# Patient Record
Sex: Male | Born: 1965 | Race: Black or African American | Hispanic: No | Marital: Single | Smoking: Never smoker
Health system: Southern US, Community
[De-identification: ages and names within clinical notes are randomized; demographics above are authoritative.]

## PROBLEM LIST (undated history)

## (undated) DIAGNOSIS — I219 Acute myocardial infarction, unspecified: Secondary | ICD-10-CM

---

## 2011-07-07 ENCOUNTER — Emergency Department: Payer: Self-pay | Admitting: Unknown Physician Specialty

## 2011-07-07 LAB — COMPREHENSIVE METABOLIC PANEL
Alkaline Phosphatase: 74 U/L (ref 50–136)
Anion Gap: 11 (ref 7–16)
Bilirubin,Total: 0.1 mg/dL — ABNORMAL LOW (ref 0.2–1.0)
Calcium, Total: 8.8 mg/dL (ref 8.5–10.1)
Chloride: 104 mmol/L (ref 98–107)
EGFR (African American): >60
EGFR (Non-African Amer.): >60
Glucose: 212 mg/dL — ABNORMAL HIGH (ref 65–99)
Osmolality: 285 (ref 275–301)
Potassium: 3.8 mmol/L (ref 3.5–5.1)
SGOT(AST): 28 U/L (ref 15–37)
Total Protein: 7.9 g/dL (ref 6.4–8.2)

## 2011-07-07 LAB — CBC WITH DIFFERENTIAL/PLATELET
Basophil #: 0 10*3/uL (ref 0.0–0.1)
Eosinophil #: 0.1 10*3/uL (ref 0.0–0.7)
Eosinophil %: 1.1 %
HGB: 14.2 g/dL (ref 13.0–18.0)
MCV: 90 fL (ref 80–100)
Neutrophil #: 4 10*3/uL (ref 1.4–6.5)
Neutrophil %: 58.2 %
Platelet: 219 10*3/uL (ref 150–440)
RBC: 4.87 10*6/uL (ref 4.40–5.90)
RDW: 14.2 % (ref 11.5–14.5)
WBC: 6.8 10*3/uL (ref 3.8–10.6)

## 2011-07-07 LAB — URINALYSIS, COMPLETE
Bacteria: NONE SEEN
Bilirubin,UR: NEGATIVE
Glucose,UR: 50 mg/dL (ref 0–75)
Ketone: NEGATIVE
Leukocyte Esterase: NEGATIVE
Nitrite: NEGATIVE
Protein: NEGATIVE
Specific Gravity: 1.021 (ref 1.003–1.030)
Squamous Epithelial: <1
WBC UR: <1 /HPF (ref 0–5)

## 2011-07-07 LAB — CK TOTAL AND CKMB (NOT AT ARMC): CK, Total: 304 U/L — ABNORMAL HIGH (ref 35–232)

## 2017-07-27 ENCOUNTER — Encounter (HOSPITAL_COMMUNITY): Payer: Self-pay | Admitting: Emergency Medicine

## 2017-07-27 ENCOUNTER — Emergency Department (HOSPITAL_COMMUNITY): Payer: Medicare Other

## 2017-07-27 ENCOUNTER — Emergency Department (HOSPITAL_COMMUNITY)
Admission: EM | Admit: 2017-07-27 | Discharge: 2017-07-27 | Disposition: A | Discharge status: Home / Self Care | Payer: Medicare Other | Attending: Emergency Medicine | Admitting: Emergency Medicine

## 2017-07-27 DIAGNOSIS — Z7982 Long term (current) use of aspirin: Secondary | ICD-10-CM | POA: Insufficient documentation

## 2017-07-27 DIAGNOSIS — M545 Low back pain: Secondary | ICD-10-CM | POA: Diagnosis not present

## 2017-07-27 DIAGNOSIS — R079 Chest pain, unspecified: Secondary | ICD-10-CM | POA: Diagnosis not present

## 2017-07-27 DIAGNOSIS — R52 Pain, unspecified: Secondary | ICD-10-CM

## 2017-07-27 DIAGNOSIS — R1084 Generalized abdominal pain: Secondary | ICD-10-CM | POA: Diagnosis not present

## 2017-07-27 DIAGNOSIS — Z79899 Other long term (current) drug therapy: Secondary | ICD-10-CM | POA: Insufficient documentation

## 2017-07-27 LAB — URINALYSIS, ROUTINE W REFLEX MICROSCOPIC
Bacteria, UA: NONE SEEN
Bilirubin Urine: NEGATIVE
Glucose, UA: >=500 mg/dL — AB
KETONES UR: NEGATIVE mg/dL
LEUKOCYTES UA: NEGATIVE
NITRITE: NEGATIVE
PROTEIN: NEGATIVE mg/dL
Specific Gravity, Urine: 1.017 (ref 1.005–1.030)
pH: 5 (ref 5.0–8.0)

## 2017-07-27 LAB — CBC
HEMATOCRIT: 42.7 % (ref 39.0–52.0)
Hemoglobin: 13.9 g/dL (ref 13.0–17.0)
MCH: 27.8 pg (ref 26.0–34.0)
MCHC: 32.6 g/dL (ref 30.0–36.0)
MCV: 85.4 fL (ref 78.0–100.0)
Platelets: 245 10*3/uL (ref 150–400)
RBC: 5 MIL/uL (ref 4.22–5.81)
RDW: 14.5 % (ref 11.5–15.5)
WBC: 9.4 10*3/uL (ref 4.0–10.5)

## 2017-07-27 LAB — BASIC METABOLIC PANEL
Anion gap: 9 (ref 5–15)
BUN: 17 mg/dL (ref 6–20)
CHLORIDE: 102 mmol/L (ref 101–111)
CO2: 25 mmol/L (ref 22–32)
Calcium: 9.3 mg/dL (ref 8.9–10.3)
Creatinine, Ser: 1.44 mg/dL — ABNORMAL HIGH (ref 0.61–1.24)
GFR calc Af Amer: >60 mL/min (ref >60)
GFR calc non Af Amer: 56 mL/min — ABNORMAL LOW (ref >60)
Glucose, Bld: 226 mg/dL — ABNORMAL HIGH (ref 65–99)
POTASSIUM: 4.3 mmol/L (ref 3.5–5.1)
SODIUM: 136 mmol/L (ref 135–145)

## 2017-07-27 LAB — HEPATIC FUNCTION PANEL
ALBUMIN: 3.9 g/dL (ref 3.5–5.0)
ALT: 30 U/L (ref 17–63)
AST: 30 U/L (ref 15–41)
Alkaline Phosphatase: 69 U/L (ref 38–126)
BILIRUBIN TOTAL: 0.8 mg/dL (ref 0.3–1.2)
Bilirubin, Direct: 0.2 mg/dL (ref 0.1–0.5)
Indirect Bilirubin: 0.6 mg/dL (ref 0.3–0.9)
Total Protein: 7.3 g/dL (ref 6.5–8.1)

## 2017-07-27 LAB — LIPASE, BLOOD: LIPASE: 31 U/L (ref 11–51)

## 2017-07-27 LAB — ETHANOL

## 2017-07-27 LAB — RAPID URINE DRUG SCREEN, HOSP PERFORMED
Amphetamines: NOT DETECTED
BENZODIAZEPINES: NOT DETECTED
COCAINE: NOT DETECTED
Opiates: NOT DETECTED
Tetrahydrocannabinol: NOT DETECTED

## 2017-07-27 LAB — I-STAT TROPONIN, ED: Troponin i, poc: 0 ng/mL (ref 0.00–0.08)

## 2017-07-27 LAB — CBG MONITORING, ED: Glucose-Capillary: 214 mg/dL — ABNORMAL HIGH (ref 65–99)

## 2017-07-27 MED ORDER — ACETAMINOPHEN 500 MG PO TABS
1000.0000 mg | ORAL_TABLET | Freq: Once | ORAL | Status: DC
  Filled 2017-07-27: qty 2

## 2017-07-27 NOTE — Discharge Instructions (Addendum)
Testing that we did today is normal.  There is no sign of heart attack, bowel obstruction, serious infection or problems with your blood electrolytes.  Continue taking her usual medications.  Use the resource guide attached, to help you find a doctor to see for ongoing care.

## 2017-07-27 NOTE — ED Notes (Signed)
Pt was gone out of the room when this RN went in to discharge the pt. Pt was ambulatory prior to being discharged. Pt was told multiple times to wait for discharge paperwork. Pt was very eager to get home.

## 2017-07-27 NOTE — ED Notes (Signed)
Pt is c/o facial numbness that is not new. Has been going on for years, to left side of the face, no hx of trauma or surgery to back or surrounding area. Hx of heart attack in 2005, no cath was done. Pt has had several TIA incidents. Pt reports polyuria and polydipsia. Reports he goes to a physican once a month.

## 2017-07-27 NOTE — ED Notes (Signed)
Pt aware of need for urine sample. Urinal placed at bedside.

## 2017-07-27 NOTE — ED Triage Notes (Addendum)
Patient here from home with complaints of chest pain that started early this morning. Dizziness all day. Left sided facial numbness that started this morning.

## 2017-07-27 NOTE — ED Provider Notes (Signed)
[facility]-EMERGENCY DEPT Provider Note   CSN: [id number] Arrival date & time: 07/27/17  1711     History   Chief Complaint Chief Complaint  Patient presents with  . Chest Pain    HPI Aaron Meyers is a 52 y.o. male.  HPI   He presents for evaluation of recent symptoms including chest pain, back pain, and abdominal pain.  His abdominal pain is been intermittent and present for a couple of months.  He feels like he has a knot above his umbilicus and is been told that he has a hernia there, in the past.  He is not having any vomiting.  He did not eat earlier today, but is hungry, now.  He also noticed some mid low back pain, today and mid chest pain as well.  He denies cough, shortness of breath, fever, chills, diaphoresis, heart racing, near syncope or syncope.  He is new to [place], and does not have a primary care doctor yet.  He works part-time as a Electrical engineer.  He does not smoke cigarettes, drink alcohol or take illegal drugs.  There are no other known modifying factors.  History reviewed. No pertinent past medical history.  There are no active problems to display for this patient.   History reviewed. No pertinent surgical history.      Home Medications    Prior to Admission medications   Medication Sig Start Date End Date Taking? Authorizing Provider  aspirin EC 81 MG tablet Take 81 mg by mouth daily.   Yes [provider]  bictegravir-emtricitabine-tenofovir AF (BIKTARVY) 50-200-25 MG TABS tablet Take 1 tablet by mouth daily.   Yes [provider]  omeprazole (PRILOSEC) 20 MG capsule Take 20 mg by mouth daily.   Yes [provider]    Family History No family history on file.  Social History Social History   Tobacco Use  . Smoking status: Never Smoker  . Smokeless tobacco: Never Used  Substance Use Topics  . Alcohol use: Never    Frequency: Never  . Drug use: Not on file     Allergies   Patient has  no known allergies.   Review of Systems Review of Systems  All other systems reviewed and are negative.    Physical Exam Updated Vital Signs BP (!) 148/86 (BP Location: Left Arm)   Pulse 85   Temp 98.6 F (37 C) (Oral)   Resp 19   SpO2 98%   Physical Exam  Constitutional: He is oriented to person, place, and time. He appears well-developed. He does not appear ill.  Morbidly obese  HENT:  Head: Normocephalic and atraumatic.  Right Ear: External ear normal.  Left Ear: External ear normal.  Eyes: Pupils are equal, round, and reactive to light. Conjunctivae and EOM are normal.  Neck: Normal range of motion and phonation normal. Neck supple.  Cardiovascular: Normal rate, regular rhythm and normal heart sounds.  Pulmonary/Chest: Effort normal and breath sounds normal. No respiratory distress. He exhibits no bony tenderness.  Abdominal: Soft. He exhibits no distension and no mass. There is tenderness (Mild midline tenderness above the umbilicus without palpable mass.  Note patient very obese.). There is no rebound and no guarding.  Musculoskeletal: Normal range of motion.  Mild diffuse tenderness lumbar region.  No specific tenderness over the thoracic or lumbar spines.  Neurological: He is alert and oriented to person, place, and time. No cranial nerve deficit or sensory deficit. He exhibits normal muscle tone. Coordination normal.  Skin: Skin is warm, dry and intact. No rash noted. No erythema.  Psychiatric: He has a normal mood and affect. His behavior is normal. Judgment and thought content normal.  Nursing note and vitals reviewed.    ED Treatments / Results  Labs (all labs ordered are listed, but only abnormal results are displayed) Labs Reviewed  BASIC METABOLIC PANEL - Abnormal; Notable for the following components:      Result Value   Glucose, Bld 226 (*)    Creatinine, Ser 1.44 (*)    GFR calc non Af Amer 56 (*)    All other components within normal limits    URINALYSIS, ROUTINE W REFLEX MICROSCOPIC - Abnormal; Notable for the following components:   Glucose, UA >=500 (*)    Hgb urine dipstick MODERATE (*)    All other components within normal limits  RAPID URINE DRUG SCREEN, HOSP PERFORMED - Abnormal; Notable for the following components:   Barbiturates   (*)    Value: Result not available. Reagent lot number recalled by manufacturer.   All other components within normal limits  CBG MONITORING, ED - Abnormal; Notable for the following components:   Glucose-Capillary 214 (*)    All other components within normal limits  CBC  ETHANOL  LIPASE, BLOOD  HEPATIC FUNCTION PANEL  I-STAT TROPONIN, ED    EKG EKG Interpretation  Date/Time:  Thursday July 27 2017 17:23:15 EDT Ventricular Rate:  82 PR Interval:    QRS Duration: 94 QT Interval:  374 QTC Calculation: 437 R Axis:   88 Text Interpretation:  Sinus rhythm since last tracing no significant change Confirmed by Mancel Bale [phone]) on 07/27/2017 5:35:07 PM   Radiology Dg Chest 2 View  Result Date: 07/27/2017 CLINICAL DATA:  Initial evaluation for acute chest pain. EXAM: CHEST - 2 VIEW COMPARISON:  None. FINDINGS: The cardiac and mediastinal silhouettes are within normal limits. Aortic atherosclerosis. The lungs are hypoinflated. No airspace consolidation, pleural effusion, or pulmonary edema is identified. There is no pneumothorax. No acute osseous abnormality identified. IMPRESSION: 1. No active cardiopulmonary disease. 2. Aortic atherosclerosis. Electronically Signed   By: Rise Mu M.D.   On: 07/27/2017 19:00   Dg Abd 2 Views  Result Date: 07/27/2017 CLINICAL DATA:  Initial evaluation for acute generalized abdominal pain. EXAM: ABDOMEN - 2 VIEW COMPARISON:  None. FINDINGS: The bowel gas pattern is normal. There is no evidence of free air. No radio-opaque calculi or other significant radiographic abnormality is seen. IMPRESSION: Negative. Electronically Signed   By:  Rise Mu M.D.   On: 07/27/2017 19:01    Procedures Procedures (including critical care time)  Medications Ordered in ED Medications  acetaminophen (TYLENOL) tablet 1,000 mg (1,000 mg Oral Refused 07/27/17 2004)     Initial Impression / Assessment and Plan / ED Course  I have reviewed the triage vital signs and the nursing notes.  Pertinent labs & imaging results that were available during my care of the patient were reviewed by me and considered in my medical decision making (see chart for details).  Clinical Course as of Jul 27 2104  Thu Jul 27, 2017  2047 Normal  Urine rapid drug screen (hosp performed)(!) [EW]  2048 Mild elevation  CBG monitoring, ED(!) [EW]  2048 Normal  I-stat troponin, ED [EW]  2048 Normal  Ethanol [EW]  2048 CBC [EW]  2048 Normal except glucose elevated and creatinine high  Basic metabolic panel(!) [EW]  2048 Normal except elevated glucose, hemoglobin, and RBCs.  Urinalysis, Routine  w reflex microscopic(!) [EW]  2049 No acute disease, images reviewed  DG Chest 2 View [EW]  2050 No obstruction, images reviewed  DG Abd 2 Views [EW]    Clinical Course User Index [EW] Mancel BaleWentz, Lance [place], MD     Patient Vitals for the past 24 hrs:  BP Temp Temp src Pulse Resp SpO2  07/27/17 1929 (!) 148/86 - - 85 19 98 %  07/27/17 1725 115/73 98.6 F (37 C) Oral 86 20 100 %    9:06 PM Reevaluation with update and discussion. After initial assessment and treatment, an updated evaluation reveals he is ambulatory no apparent distress.  Findings discussed with patient all questions answered. Mancel BaleElliott Marivel Mcclarty   Medical Decision Making: Nonspecific back, abdomen, chest pain.  Reassuring evaluation here.  Doubt bowel obstruction, ACS, PE or pneumonia.  CRITICAL CARE-no Performed by: Mancel BaleElliott Leticia Mcdiarmid   Nursing Notes Reviewed/ Care Coordinated Applicable Imaging Reviewed Interpretation of Laboratory Data incorporated into ED treatment  The patient appears  reasonably screened and/or stabilized for discharge and I doubt any other medical condition or other [facility] requiring further screening, evaluation, or treatment in the ED at this time prior to discharge.  Plan: Home Medications-continue current medications; Home Treatments-rest, fluids; return here if the recommended treatment, does not improve the symptoms; Recommended follow up-PCP of choice as needed for ongoing management     Final Clinical Impressions(s) / ED Diagnoses   Final diagnoses:  Pain  Nonspecific chest pain  Generalized abdominal pain    ED Discharge Orders    None       Mancel BaleWentz, Larhonda Dettloff, MD 07/27/17 2108

## 2017-08-02 ENCOUNTER — Emergency Department (HOSPITAL_COMMUNITY)
Admission: EM | Admit: 2017-08-02 | Discharge: 2017-08-02 | Disposition: A | Discharge status: Home / Self Care | Payer: Medicare Other | Attending: Emergency Medicine | Admitting: Emergency Medicine

## 2017-08-02 ENCOUNTER — Other Ambulatory Visit: Payer: Self-pay

## 2017-08-02 ENCOUNTER — Emergency Department (HOSPITAL_COMMUNITY): Payer: Medicare Other

## 2017-08-02 ENCOUNTER — Encounter (HOSPITAL_COMMUNITY): Payer: Self-pay | Admitting: Emergency Medicine

## 2017-08-02 DIAGNOSIS — Z7982 Long term (current) use of aspirin: Secondary | ICD-10-CM | POA: Insufficient documentation

## 2017-08-02 DIAGNOSIS — R319 Hematuria, unspecified: Secondary | ICD-10-CM

## 2017-08-02 DIAGNOSIS — Z21 Asymptomatic human immunodeficiency virus [HIV] infection status: Secondary | ICD-10-CM | POA: Insufficient documentation

## 2017-08-02 DIAGNOSIS — R739 Hyperglycemia, unspecified: Secondary | ICD-10-CM | POA: Insufficient documentation

## 2017-08-02 DIAGNOSIS — R1033 Periumbilical pain: Secondary | ICD-10-CM | POA: Diagnosis not present

## 2017-08-02 DIAGNOSIS — Z79899 Other long term (current) drug therapy: Secondary | ICD-10-CM | POA: Insufficient documentation

## 2017-08-02 DIAGNOSIS — R079 Chest pain, unspecified: Secondary | ICD-10-CM | POA: Diagnosis present

## 2017-08-02 HISTORY — DX: Acute myocardial infarction, unspecified: I21.9

## 2017-08-02 LAB — CBC
HEMATOCRIT: 42.4 % (ref 39.0–52.0)
HEMOGLOBIN: 13.7 g/dL (ref 13.0–17.0)
MCH: 27 pg (ref 26.0–34.0)
MCHC: 32.3 g/dL (ref 30.0–36.0)
MCV: 83.6 fL (ref 78.0–100.0)
Platelets: 270 10*3/uL (ref 150–400)
RBC: 5.07 MIL/uL (ref 4.22–5.81)
RDW: 14.5 % (ref 11.5–15.5)
WBC: 9.2 10*3/uL (ref 4.0–10.5)

## 2017-08-02 LAB — HEPATIC FUNCTION PANEL
ALT: 28 U/L (ref 0–44)
AST: 25 U/L (ref 15–41)
Albumin: 3.9 g/dL (ref 3.5–5.0)
Alkaline Phosphatase: 71 U/L (ref 38–126)
BILIRUBIN DIRECT: 0.1 mg/dL (ref 0.0–0.2)
BILIRUBIN INDIRECT: 0.2 mg/dL — AB (ref 0.3–0.9)
TOTAL PROTEIN: 7.4 g/dL (ref 6.5–8.1)
Total Bilirubin: 0.3 mg/dL (ref 0.3–1.2)

## 2017-08-02 LAB — URINALYSIS, ROUTINE W REFLEX MICROSCOPIC
BACTERIA UA: NONE SEEN
Bilirubin Urine: NEGATIVE
Ketones, ur: NEGATIVE mg/dL
LEUKOCYTES UA: NEGATIVE
NITRITE: NEGATIVE
PH: 5 (ref 5.0–8.0)
Protein, ur: 100 mg/dL — AB
SPECIFIC GRAVITY, URINE: 1.022 (ref 1.005–1.030)

## 2017-08-02 LAB — BASIC METABOLIC PANEL
ANION GAP: 8 (ref 5–15)
BUN: 16 mg/dL (ref 6–20)
CALCIUM: 9.6 mg/dL (ref 8.9–10.3)
CO2: 26 mmol/L (ref 22–32)
Chloride: 102 mmol/L (ref 98–111)
Creatinine, Ser: 1.39 mg/dL — ABNORMAL HIGH (ref 0.61–1.24)
GFR calc Af Amer: >60 mL/min (ref >60)
GFR, EST NON AFRICAN AMERICAN: 58 mL/min — AB (ref >60)
Glucose, Bld: 283 mg/dL — ABNORMAL HIGH (ref 70–99)
POTASSIUM: 4 mmol/L (ref 3.5–5.1)
SODIUM: 136 mmol/L (ref 135–145)

## 2017-08-02 LAB — I-STAT TROPONIN, ED: TROPONIN I, POC: 0.01 ng/mL (ref 0.00–0.08)

## 2017-08-02 LAB — LIPASE, BLOOD: Lipase: 29 U/L (ref 11–51)

## 2017-08-02 MED ORDER — GI COCKTAIL ~~LOC~~
30.0000 mL | Freq: Once | ORAL | Status: AC
  Administered 2017-08-02: 30 mL via ORAL
  Filled 2017-08-02: qty 30

## 2017-08-02 MED ORDER — MORPHINE SULFATE (PF) 4 MG/ML IV SOLN
4.0000 mg | Freq: Once | INTRAVENOUS | Status: AC
  Administered 2017-08-02: 4 mg via INTRAVENOUS
  Filled 2017-08-02: qty 1

## 2017-08-02 MED ORDER — IOPAMIDOL (ISOVUE-300) INJECTION 61%: 100.0000 mL | Freq: Once | INTRAVENOUS | Status: DC | PRN

## 2017-08-02 MED ORDER — PROCHLORPERAZINE EDISYLATE 10 MG/2ML IJ SOLN
10.0000 mg | Freq: Once | INTRAMUSCULAR | Status: DC
  Filled 2017-08-02: qty 2

## 2017-08-02 MED ORDER — DIPHENHYDRAMINE HCL 50 MG/ML IJ SOLN
25.0000 mg | Freq: Once | INTRAMUSCULAR | Status: DC
  Filled 2017-08-02: qty 1

## 2017-08-02 MED ORDER — IOPAMIDOL (ISOVUE-300) INJECTION 61%
100.0000 mL | Freq: Once | INTRAVENOUS | Status: AC | PRN
  Administered 2017-08-02: 100 mL via INTRAVENOUS

## 2017-08-02 MED ORDER — KETOROLAC TROMETHAMINE 30 MG/ML IJ SOLN
30.0000 mg | Freq: Once | INTRAMUSCULAR | Status: AC
  Administered 2017-08-02: 30 mg via INTRAVENOUS
  Filled 2017-08-02: qty 1

## 2017-08-02 MED ORDER — ACETAMINOPHEN 325 MG PO TABS
650.0000 mg | ORAL_TABLET | Freq: Once | ORAL | Status: AC
  Administered 2017-08-02: 650 mg via ORAL
  Filled 2017-08-02: qty 2

## 2017-08-02 NOTE — ED Triage Notes (Addendum)
Pt from home with CP, central abdominal pain, leg weakness, and facial numbness. Pt states symptoms have been ongoing x months and he was seen on 6/20 for same. Pt states his central abdominal pain is the "worst" of his symptoms. Pt states it is central pain that is 8/10 and he describes it as "sharp, burning, and bloating" pt states pain is worse after eating. PT denies smoking, etoh, or drug use. Pt is not sensitive to palpation. Pt states he has been told previously he has a hernia. Pt denies nausea. Pt states he is concerned that he has stomach cancer. He has no reason other than pain to think this. Pt has no neuro deficits

## 2017-08-02 NOTE — ED Notes (Signed)
Pt left without receiving d/c paperwork or allowing tech to do d/c vitals.

## 2017-08-02 NOTE — ED Notes (Signed)
Pt informed he was up for discharge. Attempted to obtain discharge V/S, pt refused. Pt left w/o obtaining discharge papers or speaking with nurse.

## 2017-08-02 NOTE — ED Notes (Signed)
After pt asked multiple times for something for his headache, pt refused migraine cocktail. Pt also refuses cardiac monitoring to NT. Pt reports "There is nothing wrong with my heart. It is not needed."

## 2017-08-02 NOTE — ED Provider Notes (Signed)
[facility]-EMERGENC[facility] DEPT Provider Note   CSN: [id number] Arrival date & time: 08/02/17  1517     History   Chief Complaint Chief Complaint  Patient presents with  . Chest Pain  . Extremity Weakness  . Abdominal Pain    HPI Aaron Meyers is a 52 y.o. male.  The history is provided by the patient and medical records. No language interpreter was used.  Abdominal Pain   This is a chronic problem. The current episode started more than 1 week ago. The problem occurs daily. The problem has not changed since onset.The pain is associated with eating. The pain is located in the generalized abdominal region, periumbilical region and chest. The quality of the pain is burning and aching. The pain is at a severity of 10/10. The pain is severe. Associated symptoms include frequency and headaches. Pertinent negatives include anorexia, fever, belching, diarrhea, flatus, nausea, vomiting, constipation, dysuria and hematuria. The symptoms are aggravated by palpation and eating. Nothing relieves the symptoms. Past medical history comments: hernia.    Past Medical History:  Diagnosis Date  . MI (myocardial infarction) (HCC)     There are no active problems to display for this patient.   History reviewed. No pertinent surgical history.      Home Medications    Prior to Admission medications   Medication Sig Start Date End Date Taking? Authorizing Provider  aspirin EC 81 MG tablet Take 81 mg by mouth daily.    [provider]  bictegravir-emtricitabine-tenofovir AF (BIKTARVY) 50-200-25 MG TABS tablet Take 1 tablet by mouth daily.    [provider]  omeprazole (PRILOSEC) 20 MG capsule Take 20 mg by mouth daily.    [provider]    Family History No family history on file.  Social History Social History   Tobacco Use  . Smoking status: Never Smoker  . Smokeless tobacco: Never Used  Substance Use Topics  . Alcohol use: Never   Frequency: Never  . Drug use: Not on file     Allergies   Patient has no known allergies.   Review of Systems Review of Systems  Constitutional: Positive for fatigue. Negative for appetite change, chills, diaphoresis, fever and unexpected weight change.  HENT: Negative for congestion and rhinorrhea.   Eyes: Negative for photophobia and visual disturbance.  Respiratory: Negative for cough, chest tightness, shortness of breath, wheezing and stridor.   Cardiovascular: Positive for chest pain. Negative for palpitations and leg swelling.  Gastrointestinal: Positive for abdominal pain. Negative for anorexia, constipation, diarrhea, flatus, nausea and vomiting.  Genitourinary: Positive for frequency. Negative for dysuria, flank pain and hematuria.  Musculoskeletal: Positive for back pain. Negative for neck pain and neck stiffness.  Skin: Negative for rash and wound.  Neurological: Positive for numbness and headaches. Negative for dizziness, seizures, facial asymmetry, weakness and light-headedness.  Psychiatric/Behavioral: Negative for agitation.  All other systems reviewed and are negative.    Physical Exam Updated Vital Signs BP 133/89   Pulse 94   Temp 97.9 F (36.6 C) (Oral)   Resp (!) 21   SpO2 96%   Physical Exam  Constitutional: He appears well-developed and well-nourished.  Non-toxic appearance. He does not appear ill. No distress.  HENT:  Head: Normocephalic and atraumatic.  Mouth/Throat: Oropharynx is clear and moist. No oropharyngeal exudate.  Eyes: Pupils are equal, round, and reactive to light. Conjunctivae and EOM are normal. No scleral icterus.  Neck: Normal range of motion. Neck supple.  Cardiovascular: Normal rate,  regular rhythm and normal heart sounds.  No murmur heard. Pulmonary/Chest: Effort normal and breath sounds normal. No stridor. No respiratory distress. He has no wheezes. He has no rhonchi. He has no rales. He exhibits no tenderness.  Abdominal: Soft.  Normal appearance and bowel sounds are normal. There is generalized tenderness. There is no rigidity, no rebound, no guarding and no CVA tenderness.  Musculoskeletal: He exhibits no edema.  Lymphadenopathy:    He has no cervical adenopathy.  Neurological: He is alert.  Skin: Skin is warm and dry. Capillary refill takes less than 2 seconds. He is not diaphoretic. No erythema. No pallor.  Psychiatric: He has a normal mood and affect.  Nursing note and vitals reviewed.    ED Treatments / Results  Labs (all labs ordered are listed, but only abnormal results are displayed) Labs Reviewed  BASIC METABOLIC PANEL - Abnormal; Notable for the following components:      Result Value   Glucose, Bld 283 (*)    Creatinine, Ser 1.39 (*)    GFR calc non Af Amer 58 (*)    All other components within normal limits  HEPATIC FUNCTION PANEL - Abnormal; Notable for the following components:   Indirect Bilirubin 0.2 (*)    All other components within normal limits  URINALYSIS, ROUTINE W REFLEX MICROSCOPIC - Abnormal; Notable for the following components:   Glucose, UA >=500 (*)    Hgb urine dipstick MODERATE (*)    Protein, ur 100 (*)    All other components within normal limits  URINE CULTURE  CBC  LIPASE, BLOOD  I-STAT TROPONIN, ED    EKG EKG Interpretation  Date/Time:  Wednesday August 02 2017 15:28:15 EDT Ventricular Rate:  93 PR Interval:    QRS Duration: 94 QT Interval:  367 QTC Calculation: 457 R Axis:   87 Text Interpretation:  Sinus rhythm When compared to ECG 6 days ago, no significant changes seen with similar S1Q3T3 pattern.  No STEMI Confirmed by Theda Belfast ([id number]) on 08/02/2017 4:18:32 PM Also confirmed by Theda Belfast ([id number]), editor Josephine Igo [phone])  on 08/02/2017 4:38:50 PM   Radiology Dg Chest 2 View  Result Date: 08/02/2017 CLINICAL DATA:  Chest pain EXAM: CHEST - 2 VIEW COMPARISON:  07/27/2017 chest radiograph. FINDINGS: Stable cardiomediastinal silhouette  with normal heart size. No pneumothorax. No pleural effusion. Lungs appear clear, with no acute consolidative airspace disease and no pulmonary edema. IMPRESSION: No active cardiopulmonary disease. Electronically Signed   By: Delbert Phenix M.D.   On: 08/02/2017 17:44    Procedures Procedures (including critical care time)  Medications Ordered in ED Medications  gi cocktail (Maalox,Lidocaine,Donnatal) (30 mLs Oral Given 08/02/17 1659)  morphine 4 MG/ML injection 4 mg (4 mg Intravenous Given 08/02/17 1659)  acetaminophen (TYLENOL) tablet 650 mg (650 mg Oral Given 08/02/17 1758)  ketorolac (TORADOL) 30 MG/ML injection 30 mg (30 mg Intravenous Given 08/02/17 1909)  iopamidol (ISOVUE-300) 61 % injection 100 mL (100 mLs Intravenous Contrast Given 08/02/17 1849)     Initial Impression / Assessment and Plan / ED Course  I have reviewed the triage vital signs and the nursing notes.  Pertinent labs & imaging results that were available during my care of the patient were reviewed by me and considered in my medical decision making (see chart for details).     Aaron Meyers is a 52 y.o. male with a past medical history for borderline diabetes, well-controlled HIV, and prior MI in 2005 who presents with abdominal pain, abdominal  distention, fatigue, chest pain, bilateral leg weakness, intermittent headaches, urinary frequency, and intermittent paresthesias.  Patient reports that he is most concerned that he has abdominal cancer after reading about on the Internet.  Patient says that he has had on and off abdominal pain for the last month.  He reports that he was seen last week with a reassuring work-up but is continued to be concerned and have symptoms.  He reports that he has had normal bowel movements with no conservation or diarrhea.  She denies nausea or vomiting.  He denies any night sweats or weight loss.  He reports that he has a history of a umbilical hernia that has also been the location of severe pain.   He describes his abdominal pain is 10 out of 10 and intermittent.  He reports that wraps around towards his back.  He reports generalized fatigue, bilateral weakness in his legs, and intermittent headaches.  He reports occasional paresthesias including in his jaw.  He reports his abdominal pain and chest pain are burning and aching.  He reports that his HIV is well controlled and his last CD4 was reportedly well and his viral load was undetectable.  He denies other complaints on arrival.  On exam, abdomen is tender at the location of his periumbilical hernia.  Patient has a large abdomen and it is difficult to palpate the hernia.  Normal bowel sounds are appreciated.  Chest is nontender and lungs are clear.  Patient has no weakness in extremities and no numbness appreciated.  No neck stiffness or neck tenderness.  Back nontender.    Patient's EKG appeared unchanged from prior.  No STEMI seen.  With the patient's reported umbilical hernia with abdominal distention and tense abdominal pain, it is felt appropriate for him to have laboratory testing and CT imaging to rule out incarceration.  Patient will also have chest x-ray, and lab testing to further evaluate his chest pain.  Patient Will have urinalysis given his urinary frequency.    Patient will be given GI cocktail as his burning pain in his abdomen and chest when he lays dow or after eating sounds somewhat reflux related.  He also given pain medicine for the possible hernia.  Anticipate reassessment after work-up.  8:06 PM When patient went into CT scanner for his CT head and CT abdomen, he sat up and said he could not do it.  When patient was reassessed, he reports his symptoms resolved.  He would rather go home.  Given his reassuring work-up and his resolved symptoms, I feel patient is safe for discharge home.  Patient instructed to follow-up with his PCP and stay hydrated.  He was instructed to return with any new or worsened symptoms.   Patient was understanding the plan of care and was discharged in good condition.  Final Clinical Impressions(s) / ED Diagnoses   Final diagnoses:  Periumbilical abdominal pain  Hyperglycemia  Hematuria, unspecified type    ED Discharge Orders    None     Clinical Impression: 1. Periumbilical abdominal pain   2. Hyperglycemia   3. Hematuria, unspecified type     Disposition: Discharge  Condition: Good  I have discussed the results, Dx and Tx plan with the pt(& family if present). He/she/they expressed understanding and agree(s) with the plan. Discharge instructions discussed at great length. Strict return precautions discussed and pt &/or family have verbalized understanding of the instructions. No further questions at time of discharge.    New Prescriptions   No  medications on file    Follow Up: [facility] [address] [place] [address] [phone] Schedule an appointment as soon as possible for a visit    [facility] [address] [id number] mc [place] [address] [phone]       Tomoko Sandra, Canary Brim, MD 08/03/17 [phone]

## 2017-08-02 NOTE — ED Notes (Addendum)
Pt has called out multiple times regarding headache

## 2017-08-02 NOTE — Discharge Instructions (Addendum)
Your work-up today was overall reassuring.  We did find you still had some blood in your urine, however your kidney function was improving.  We initially ordered a CT of your abdomen/pelvis as well as her head but you did not tolerate this.  Since her symptoms have improved and you want to go home, we feel you are safe for discharge home.  Please follow-up with your primary doctor in the next several days.  If any symptoms change or worsen, please return to the nearest emerge department.

## 2017-08-02 NOTE — ED Notes (Signed)
Pt stated he did not want to be on cardiac monitoring and that he was not here for "anything like that".

## 2017-08-02 NOTE — ED Notes (Signed)
Pt continues to refuse cardiac monitoring

## 2017-08-03 LAB — URINE CULTURE: Culture: NO GROWTH

## 2019-12-13 IMAGING — CR DG CHEST 2V
2 series · 2 of 2 positions shown · non-contrast
Comparison: 07/27/2017 chest radiograph.

CLINICAL DATA: Chest pain

EXAM:
CHEST - 2 VIEW

[w chest lat]
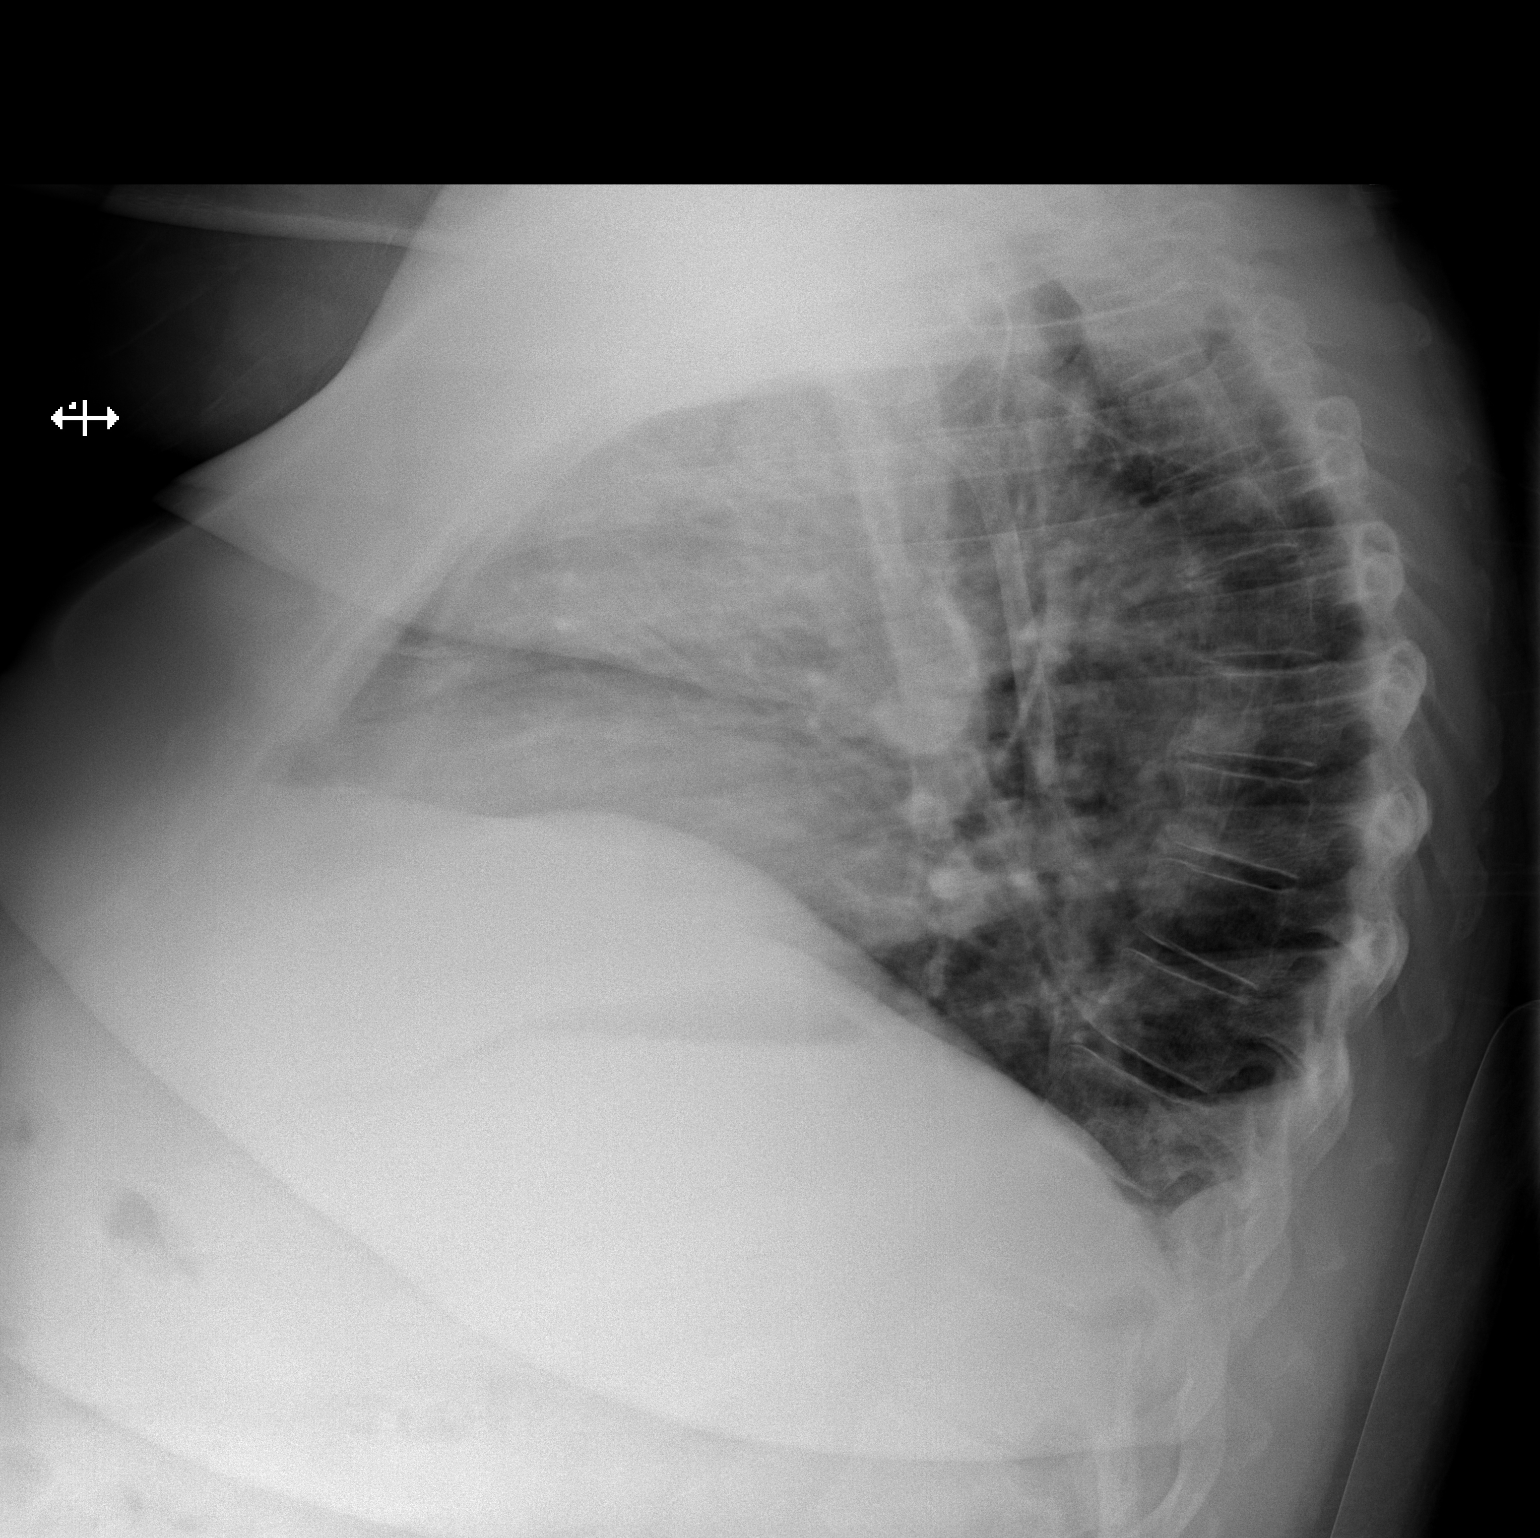

[x chest ap]
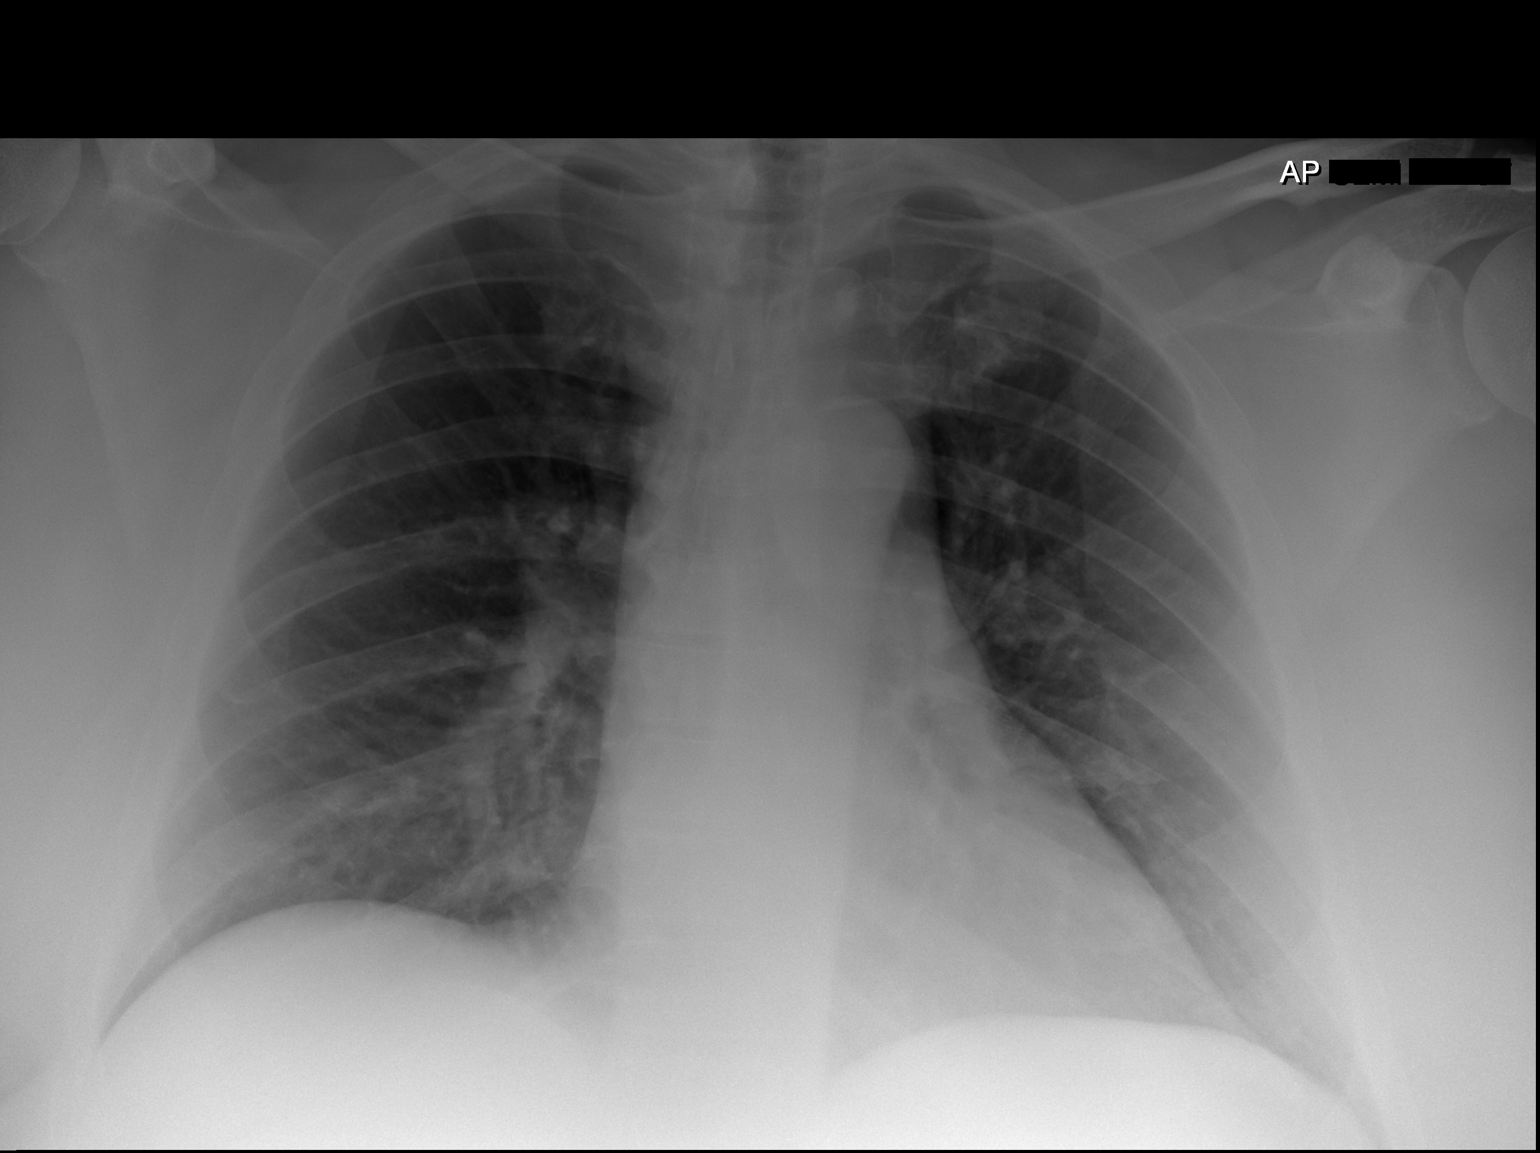

[2 of 2 positions shown; findings below may reference images not displayed]

FINDINGS: Stable cardiomediastinal silhouette with normal heart size. No
pneumothorax. No pleural effusion. Lungs appear clear, with no acute
consolidative airspace disease and no pulmonary edema.
IMPRESSION: No active cardiopulmonary disease.
